# Patient Record
Sex: Male | Born: 1990 | Race: Black or African American | Hispanic: No | Marital: Single | Smoking: Current every day smoker
Health system: Southern US, Community
[De-identification: ages and names within clinical notes are randomized; demographics above are authoritative.]

---

## 2009-08-21 ENCOUNTER — Emergency Department (HOSPITAL_COMMUNITY): Admission: EM | Admit: 2009-08-21 | Discharge: 2009-08-21 | Payer: Self-pay | Admitting: Emergency Medicine

## 2017-04-01 ENCOUNTER — Emergency Department (HOSPITAL_BASED_OUTPATIENT_CLINIC_OR_DEPARTMENT_OTHER)
Admission: EM | Admit: 2017-04-01 | Discharge: 2017-04-01 | Disposition: A | Discharge status: Home / Self Care | Payer: Self-pay | Attending: Emergency Medicine | Admitting: Emergency Medicine

## 2017-04-01 ENCOUNTER — Encounter (HOSPITAL_BASED_OUTPATIENT_CLINIC_OR_DEPARTMENT_OTHER): Payer: Self-pay | Admitting: *Deleted

## 2017-04-01 ENCOUNTER — Other Ambulatory Visit: Payer: Self-pay

## 2017-04-01 DIAGNOSIS — J069 Acute upper respiratory infection, unspecified: Secondary | ICD-10-CM | POA: Insufficient documentation

## 2017-04-01 DIAGNOSIS — F17200 Nicotine dependence, unspecified, uncomplicated: Secondary | ICD-10-CM | POA: Insufficient documentation

## 2017-04-01 MED ORDER — PROMETHAZINE-DM 6.25-15 MG/5ML PO SYRP: 5.0000 mL | ORAL_SOLUTION | Freq: Four times a day (QID) | ORAL | 0 refills | Status: DC | PRN

## 2017-04-01 MED ORDER — BENZONATATE 100 MG PO CAPS: 100.0000 mg | ORAL_CAPSULE | Freq: Three times a day (TID) | ORAL | 0 refills | Status: DC

## 2017-04-01 NOTE — ED Notes (Signed)
Cough, congestion, chills x 2 days

## 2017-04-01 NOTE — ED Triage Notes (Signed)
Cough, congestion, and chills x 2 days

## 2017-04-01 NOTE — ED Provider Notes (Signed)
[facility] EMERGENCY DEPARTMENT Provider Note   CSN: [id number] Arrival date & time: 04/01/17  2039     History   Chief Complaint Chief Complaint  Patient presents with  . Nasal Congestion    HPI Taylor Fields is a 27 y.o. male.  HPI  27 year old male who is generally healthy presenting with cold symptoms.  For the past 2 days patient has had runny nose, sneezing, coughing, throat irritation, feeling tired and having some chills.  No report of fever, cough is nonproductive, no shortness of breath, and no nausea vomiting or diarrhea.  He has tried some over-the-counter medication such as Mucinex with minimal improvement.  He denies any recent travel.  Denies any recent sick contact.  History reviewed. No pertinent past medical history.  There are no active problems to display for this patient.   History reviewed. No pertinent surgical history.     Home Medications    Prior to Admission medications   Not on File    Family History No family history on file.  Social History Social History   Tobacco Use  . Smoking status: Current Every Day Smoker  . Smokeless tobacco: Never Used  Substance Use Topics  . Alcohol use: Yes    Comment: 2x weekly  . Drug use: No     Allergies   Patient has no known allergies.   Review of Systems Review of Systems  All other systems reviewed and are negative.    Physical Exam Updated Vital Signs BP (!) 147/85 (BP Location: Left Arm)   Pulse 92   Temp 98.7 F (37.1 C) (Oral)   Resp 18   Ht 6\' 3"  (1.905 m)   Wt 111.1 kg (245 lb)   SpO2 100%   BMI 30.62 kg/m   Physical Exam  Constitutional: He is oriented to person, place, and time. He appears well-developed and well-nourished. No distress.  HENT:  Head: Atraumatic.  Right Ear: External ear normal.  Left Ear: External ear normal.  Mouth/Throat: Oropharynx is clear and moist.  Eyes: Conjunctivae are normal.  Neck: Normal range of motion. Neck supple.    Cardiovascular: Normal rate and regular rhythm.  Pulmonary/Chest: Effort normal and breath sounds normal. No respiratory distress. He has no rales.  Abdominal: Soft. He exhibits no distension. There is no tenderness.  Lymphadenopathy:    He has no cervical adenopathy.  Neurological: He is alert and oriented to person, place, and time.  Skin: No rash noted.  Psychiatric: He has a normal mood and affect.  Nursing note and vitals reviewed.    ED Treatments / Results  Labs (all labs ordered are listed, but only abnormal results are displayed) Labs Reviewed - No data to display  EKG  EKG Interpretation None       Radiology No results found.  Procedures Procedures (including critical care time)  Medications Ordered in ED Medications - No data to display   Initial Impression / Assessment and Plan / ED Course  I have reviewed the triage vital signs and the nursing notes.  Pertinent labs & imaging results that were available during my care of the patient were reviewed by me and considered in my medical decision making (see chart for details).     BP (!) 147/85 (BP Location: Left Arm)   Pulse 92   Temp 98.7 F (37.1 C) (Oral)   Resp 18   Ht 6\' 3"  (1.905 m)   Wt 111.1 kg (245 lb)   SpO2 100%  BMI 30.62 kg/m    Final Clinical Impressions(s) / ED Diagnoses   Final diagnoses:  Acute upper respiratory infection    ED Discharge Orders        Ordered    benzonatate (TESSALON) 100 MG capsule  Every 8 hours     04/01/17 2144    promethazine-dextromethorphan (PROMETHAZINE-DM) 6.25-15 MG/5ML syrup  4 times daily PRN     04/01/17 2144     Pt symptoms consistent with URI.  Pt will be discharged with symptomatic treatment.  Discussed return precautions.  Pt is hemodynamically stable & in NAD prior to discharge.     Fayrene Helper, PA-C 04/01/17 4098    Gwyneth Sprout, MD 04/03/17 1539

## 2017-04-01 NOTE — ED Notes (Signed)
Pt given d/c instructions as per chart. Rx x 2. Verbalizes understanding. No questions. 

## 2017-06-21 ENCOUNTER — Other Ambulatory Visit: Payer: Self-pay

## 2017-06-21 ENCOUNTER — Emergency Department (HOSPITAL_BASED_OUTPATIENT_CLINIC_OR_DEPARTMENT_OTHER)
Admission: EM | Admit: 2017-06-21 | Discharge: 2017-06-21 | Disposition: A | Discharge status: Home / Self Care | Payer: Self-pay | Attending: Emergency Medicine | Admitting: Emergency Medicine

## 2017-06-21 ENCOUNTER — Encounter (HOSPITAL_BASED_OUTPATIENT_CLINIC_OR_DEPARTMENT_OTHER): Payer: Self-pay | Admitting: *Deleted

## 2017-06-21 DIAGNOSIS — J111 Influenza due to unidentified influenza virus with other respiratory manifestations: Secondary | ICD-10-CM | POA: Insufficient documentation

## 2017-06-21 DIAGNOSIS — F17210 Nicotine dependence, cigarettes, uncomplicated: Secondary | ICD-10-CM | POA: Insufficient documentation

## 2017-06-21 DIAGNOSIS — R69 Illness, unspecified: Secondary | ICD-10-CM

## 2017-06-21 MED ORDER — ONDANSETRON HCL 4 MG PO TABS: 4.0000 mg | ORAL_TABLET | Freq: Three times a day (TID) | ORAL | 0 refills | Status: AC | PRN

## 2017-06-21 MED ORDER — OSELTAMIVIR PHOSPHATE 75 MG PO CAPS
75.0000 mg | ORAL_CAPSULE | Freq: Once | ORAL | Status: AC
  Administered 2017-06-21: 75 mg via ORAL
  Filled 2017-06-21: qty 1

## 2017-06-21 MED ORDER — OSELTAMIVIR PHOSPHATE 75 MG PO CAPS: 75.0000 mg | ORAL_CAPSULE | Freq: Two times a day (BID) | ORAL | 0 refills | Status: AC

## 2017-06-21 MED ORDER — BENZONATATE 100 MG PO CAPS: 100.0000 mg | ORAL_CAPSULE | Freq: Three times a day (TID) | ORAL | 0 refills | Status: AC | PRN

## 2017-06-21 NOTE — ED Provider Notes (Signed)
Emergency Department Provider Note   I have reviewed the triage vital signs and the nursing notes.   HISTORY  Chief Complaint Cough   HPI Taylor Fields is a 27 y.o. male smoker without any significant medical problems or presents to the emergency department today secondary to cough.  Patient states that he came up from yesterday and within a few hours started feeling really ill.  He was having muscle aches, joint aches, fever, cough, congestion, runny nose and throughout the night this all continued and he started having chills as well.  Max temperature overnight was 102.  Not exposed to be also similar illnesses that he knows of.  No other medical problems.  Try some Tylenol at home but did not seem to help.  Did not try anything else did not see anyone else for symptoms. No other associated or modifying symptoms.    History reviewed. No pertinent past medical history.  There are no active problems to display for this patient.   History reviewed. No pertinent surgical history.  Current Outpatient Rx  . Order #: [id number] Class: Print  . Order #: [id number] Class: Print  . Order #: [id number] Class: Print    Allergies Patient has no known allergies.  No family history on file.  Social History Social History   Tobacco Use  . Smoking status: Current Every Day Smoker  . Smokeless tobacco: Never Used  Substance Use Topics  . Alcohol use: Yes    Comment: 2x weekly  . Drug use: No    Review of Systems  All other systems negative except as documented in the HPI. All pertinent positives and negatives as reviewed in the HPI. ____________________________________________   PHYSICAL EXAM:  VITAL SIGNS: ED Triage Vitals  Enc Vitals Group     BP 06/21/17 0657 131/64     Pulse Rate 06/21/17 0657 86     Resp 06/21/17 0657 18     Temp 06/21/17 0657 100 F (37.8 C)     Temp Source 06/21/17 0657 Oral     SpO2 06/21/17 0657 99 %     Weight 06/21/17 0657 250 lb (113.4 kg)   Height 06/21/17 0657 6\' 3"  (1.905 m)     Head Circumference --      Peak Flow --      Pain Score 06/21/17 0659 9     Pain Loc --      Pain Edu? --      Excl. in GC? --     Constitutional: Alert and oriented. Well appearing and in no acute distress. Eyes: Conjunctivae are normal. PERRL. EOMI. Head: Atraumatic. Nose: No congestion/rhinnorhea. Mouth/Throat: Mucous membranes are moist.  Oropharynx non-erythematous. Neck: No stridor.  No meningeal signs.   Cardiovascular: Normal rate, regular rhythm. Good peripheral circulation. Grossly normal heart sounds.   Respiratory: Normal respiratory effort.  No retractions. Lungs CTAB. Gastrointestinal: Soft and nontender. No distention.  Musculoskeletal: No lower extremity tenderness nor edema. No gross deformities of extremities. Neurologic:  Normal speech and language. No gross focal neurologic deficits are appreciated.  Skin:  Skin is warm, dry and intact. No rash noted.   ____________________________________________   INITIAL IMPRESSION / ASSESSMENT AND PLAN / ED COURSE  Signs and symptoms consistent with influenza or influenza-like illness.  Will treat appropriately.  No evidence of pneumonia, bronchitis or anything requiring antibiotics.  No indication for imaging.   Pertinent labs & imaging results that were available during my care of the patient were reviewed by me and considered in  my medical decision making (see chart for details).  ____________________________________________  FINAL CLINICAL IMPRESSION(S) / ED DIAGNOSES  Final diagnoses:  Influenza-like illness    MEDICATIONS GIVEN DURING THIS VISIT:  Medications  oseltamivir (TAMIFLU) capsule 75 mg (has no administration in time range)     NEW OUTPATIENT MEDICATIONS STARTED DURING THIS VISIT:  New Prescriptions   BENZONATATE (TESSALON) 100 MG CAPSULE    Take 1 capsule (100 mg total) by mouth 3 (three) times daily as needed for cough.   ONDANSETRON (ZOFRAN) 4 MG  TABLET    Take 1 tablet (4 mg total) by mouth every 8 (eight) hours as needed for nausea or vomiting.   OSELTAMIVIR (TAMIFLU) 75 MG CAPSULE    Take 1 capsule (75 mg total) by mouth every 12 (twelve) hours.    Note:  This note was prepared with assistance of Dragon voice recognition software. Occasional wrong-word or sound-a-like substitutions may have occurred due to the inherent limitations of voice recognition software.   Marily Memos, MD 06/21/17 [phone]

## 2017-06-21 NOTE — ED Triage Notes (Signed)
Pt reports coughing, sneezing and chills. Last tylenol at 2100 last night.

## 2020-01-15 ENCOUNTER — Emergency Department (INDEPENDENT_AMBULATORY_CARE_PROVIDER_SITE_OTHER)
Admission: EM | Admit: 2020-01-15 | Discharge: 2020-01-15 | Disposition: A | Discharge status: Home / Self Care | Payer: Managed Care, Other (non HMO) | Source: Home / Self Care | Attending: Family Medicine | Admitting: Family Medicine

## 2020-01-15 ENCOUNTER — Other Ambulatory Visit: Payer: Self-pay

## 2020-01-15 ENCOUNTER — Emergency Department (INDEPENDENT_AMBULATORY_CARE_PROVIDER_SITE_OTHER): Payer: Managed Care, Other (non HMO)

## 2020-01-15 DIAGNOSIS — M79671 Pain in right foot: Secondary | ICD-10-CM

## 2020-01-15 DIAGNOSIS — M722 Plantar fascial fibromatosis: Secondary | ICD-10-CM

## 2020-01-15 MED ORDER — PREDNISONE 20 MG PO TABS: ORAL_TABLET | ORAL | 0 refills | Status: AC

## 2020-01-15 NOTE — ED Provider Notes (Signed)
[facility]    CSN: [id number] Arrival date & time: 01/15/20  0737      History   Chief Complaint Chief Complaint  Patient presents with  . Foot Pain    RT heel    HPI Taylor Fields is a 29 y.o. male.   Patient complains of intermittent pain in his right heel for about two years, worse recently.  He recalls no injury.  The history is provided by the patient.  Foot Pain This is a chronic problem. Episode onset: 2 years ago. The problem occurs constantly. The problem has been gradually worsening. The symptoms are aggravated by walking and standing. Nothing relieves the symptoms. He has tried nothing for the symptoms.    History reviewed. No pertinent past medical history.  There are no problems to display for this patient.   History reviewed. No pertinent surgical history.     Home Medications    Prior to Admission medications   Medication Sig Start Date End Date Taking? Authorizing Provider  benzonatate (TESSALON) 100 MG capsule Take 1 capsule (100 mg total) by mouth 3 (three) times daily as needed for cough. 06/21/17   Mesner, Barbara Cower, MD  ondansetron (ZOFRAN) 4 MG tablet Take 1 tablet (4 mg total) by mouth every 8 (eight) hours as needed for nausea or vomiting. 06/21/17   Mesner, Barbara Cower, MD  oseltamivir (TAMIFLU) 75 MG capsule Take 1 capsule (75 mg total) by mouth every 12 (twelve) hours. 06/21/17   Mesner, Barbara Cower, MD  predniSONE (DELTASONE) 20 MG tablet Take one tab by mouth twice daily for 4 days, then one daily for 3 days. Take with food. 01/15/20   Lattie Haw, MD    Family History History reviewed. No pertinent family history.  Social History Social History   Tobacco Use  . Smoking status: Current Every Day Smoker  . Smokeless tobacco: Never Used  Vaping Use  . Vaping Use: Never used  Substance Use Topics  . Alcohol use: Yes    Comment: 2x weekly  . Drug use: No     Allergies   Patient has no known allergies.   Review of  Systems Review of Systems  Constitutional: Negative for chills, diaphoresis, fatigue and fever.  Musculoskeletal:       Right heel pain  Skin: Negative for color change.  All other systems reviewed and are negative.    Physical Exam Triage Vital Signs ED Triage Vitals  Enc Vitals Group     BP 01/15/20 1906 131/68     Pulse Rate 01/15/20 1906 84     Resp 01/15/20 1906 17     Temp 01/15/20 1906 98 F (36.7 C)     Temp Source 01/15/20 1906 Oral     SpO2 01/15/20 1906 98 %     Weight --      Height --      Head Circumference --      Peak Flow --      Pain Score 01/15/20 1909 7     Pain Loc --      Pain Edu? --      Excl. in GC? --    No data found.  Updated Vital Signs BP 131/68 (BP Location: Right Arm)   Pulse 84   Temp 98 F (36.7 C) (Oral)   Resp 17   SpO2 98%   Visual Acuity Right Eye Distance:   Left Eye Distance:   Bilateral Distance:    Right Eye Near:   Left  Eye Near:    Bilateral Near:     Physical Exam Vitals and nursing note reviewed.  Constitutional:      General: He is not in acute distress. HENT:     Head: Normocephalic.  Eyes:     Pupils: Pupils are equal, round, and reactive to light.  Cardiovascular:     Rate and Rhythm: Normal rate.  Pulmonary:     Effort: Pulmonary effort is normal.  Musculoskeletal:     Comments: There is distinct tenderness to palpation over the plantar surface of the right heel.  No swelling, erythema, or warmth.  Skin:    General: Skin is warm and dry.  Neurological:     Mental Status: He is alert.      UC Treatments / Results  Labs (all labs ordered are listed, but only abnormal results are displayed) Labs Reviewed - No data to display  EKG   Radiology DG Os Calcis Right  Result Date: 01/15/2020 CLINICAL DATA:  Rule out stress fracture, right heel pain for 1 month, no known injury EXAM: RIGHT OS CALCIS - 2+ VIEW COMPARISON:  None. FINDINGS: No clear sclerotic band, cortical disruption or abnormality  perpendicular to the trabecular lines of the calcaneus to reflect acute or healing stress fracture/stress reaction. Midfoot and hindfoot alignment is grossly preserved within the limitations of a nonweightbearing exam. Small corticated os trigonum and os peroneum are noted. IMPRESSION: No radiographic evidence of stress fracture/stress reaction. Electronically Signed   By: Kreg Shropshire M.D.   On: 01/15/2020 19:31    Procedures Procedures (including critical care time)  Medications Ordered in UC Medications - No data to display  Initial Impression / Assessment and Plan / UC Course  I have reviewed the triage vital signs and the nursing notes.  Pertinent labs & imaging results that were available during my care of the patient were reviewed by me and considered in my medical decision making (see chart for details).    Begin prednisone burst/taper. Given sprain treatment instructions with range of motion and stretching exercises. Followup with Dr[facility] if not improving about two weeks.    Final Clinical Impressions(s) / UC Diagnoses   Final diagnoses:  Plantar fasciitis of right foot     Discharge Instructions     Obtain good shoe inserts for both shoes that have a heel cup and good arch support.  Apply ice pack for 15 to 20 minutes, 3 to 4 times daily  Continue until pain decreases.  Obtain night splints (try amazon.com).  Begin calf stretching exercises.    ED Prescriptions    Medication Sig Dispense Auth. Provider   predniSONE (DELTASONE) 20 MG tablet Take one tab by mouth twice daily for 4 days, then one daily for 3 days. Take with food. 11 tablet Lattie Haw, MD        Lattie Haw, MD 01/20/20 [phone]

## 2020-01-15 NOTE — Discharge Instructions (Addendum)
Obtain good shoe inserts for both shoes that have a heel cup and good arch support.  Apply ice pack for 15 to 20 minutes, 3 to 4 times daily  Continue until pain decreases.  Obtain night splints (try amazon.com).  Begin calf stretching exercises.

## 2020-01-15 NOTE — ED Triage Notes (Signed)
Pt c/o intermittent RT heel pain x 1 year. Pain worsens with with flexing of foot. Constantly on feet at work. Pain is throbbing, sometimes sharp.  Pain 7/10

## 2020-08-24 ENCOUNTER — Encounter: Payer: Self-pay | Admitting: Emergency Medicine

## 2020-08-24 ENCOUNTER — Emergency Department
Admission: EM | Admit: 2020-08-24 | Discharge: 2020-08-24 | Disposition: A | Discharge status: Home / Self Care | Payer: Managed Care, Other (non HMO) | Source: Home / Self Care

## 2020-08-24 ENCOUNTER — Other Ambulatory Visit: Payer: Self-pay

## 2020-08-24 DIAGNOSIS — J0140 Acute pansinusitis, unspecified: Secondary | ICD-10-CM | POA: Diagnosis not present

## 2020-08-24 MED ORDER — AMOXICILLIN-POT CLAVULANATE 875-125 MG PO TABS: 1.0000 | ORAL_TABLET | Freq: Two times a day (BID) | ORAL | 0 refills | Status: AC

## 2020-08-24 NOTE — ED Provider Notes (Signed)
[facility]   [id number] 08/24/20 Arrival Time: 1716  IE:PPIR THROAT  SUBJECTIVE: History from: patient.  Paiton Fosco is a 30 y.o. male who presents with abrupt onset of nasal congestion, headache, fatigue for the last 5 days. Reports purulent nasal discharge. Reports negative Covid test at home yesterday. Denies sick exposure to Covid, strep, flu or mono, or precipitating event. Has negative history of Covid. Has not had Covid vaccines. Has tried sudafed and zyrtec without relief. There are no aggravating symptoms. Denies previous symptoms in the past.     Denies fever, chills, ear pain, rhinorrhea, cough, SOB, wheezing, chest pain, nausea, rash, changes in bowel or bladder habits.     ROS: As per HPI.  All other pertinent ROS negative.     History reviewed. No pertinent past medical history. History reviewed. No pertinent surgical history. No Known Allergies No current facility-administered medications on file prior to encounter.   Current Outpatient Medications on File Prior to Encounter  Medication Sig Dispense Refill   benzonatate (TESSALON) 100 MG capsule Take 1 capsule (100 mg total) by mouth 3 (three) times daily as needed for cough. 21 capsule 0   ondansetron (ZOFRAN) 4 MG tablet Take 1 tablet (4 mg total) by mouth every 8 (eight) hours as needed for nausea or vomiting. 12 tablet 0   oseltamivir (TAMIFLU) 75 MG capsule Take 1 capsule (75 mg total) by mouth every 12 (twelve) hours. 10 capsule 0   predniSONE (DELTASONE) 20 MG tablet Take one tab by mouth twice daily for 4 days, then one daily for 3 days. Take with food. 11 tablet 0   Social History   Socioeconomic History   Marital status: Single    Spouse name: Not on file   Number of children: Not on file   Years of education: Not on file   Highest education level: Not on file  Occupational History   Not on file  Tobacco Use   Smoking status: Every Day    Pack years: 0.00   Smokeless tobacco: Never   Vaping Use   Vaping Use: Never used  Substance and Sexual Activity   Alcohol use: Yes    Comment: 2x weekly   Drug use: No   Sexual activity: Not on file  Other Topics Concern   Not on file  Social History Narrative   Not on file   Social Determinants of Health   Financial Resource Strain: Not on file  Food Insecurity: Not on file  Transportation Needs: Not on file  Physical Activity: Not on file  Stress: Not on file  Social Connections: Not on file  Intimate Partner Violence: Not on file   No family history on file.  OBJECTIVE:  Vitals:   08/24/20 1741 08/24/20 1748  BP:  124/74  Pulse:  65  Resp:  16  Temp:  98.2 F (36.8 C)  TempSrc:  Oral  SpO2:  98%  Weight: 235 lb (106.6 kg)   Height: 6\' 3"  (1.905 m)      General appearance: alert; appears fatigued, but nontoxic, speaking in full sentences and managing own secretions HEENT: NCAT; Ears: EACs clear, TMs pearly gray with visible cone of light, without erythema; Eyes: PERRL, EOMI grossly; Nose: no obvious rhinorrhea; Throat: oropharynx clear, tonsils 1+ and mildly erythematous without white tonsillar exudates, uvula midline; Sinuses: tender to palpation Neck: supple with LAD Lungs: CTA bilaterally without adventitious breath sounds; cough absent Heart: regular rate and rhythm.  Radial pulses 2+ symmetrical bilaterally Skin:  warm and dry Psychological: alert and cooperative; normal mood and affect  LABS: No results found for this or any previous visit (from the past 24 hour(s)).   ASSESSMENT & PLAN:  1. Acute non-recurrent pansinusitis     Meds ordered this encounter  Medications   amoxicillin-clavulanate (AUGMENTIN) 875-125 MG tablet    Sig: Take 1 tablet by mouth 2 (two) times daily for 7 days.    Dispense:  14 tablet    Refill:  0    Order Specific Question:   Supervising Provider    Answer:   Merrilee Jansky [id number]     Acute Sinusitis Push fluids and get rest Prescribed Augmentin 875 mg  BID x 7 days as empiric therapy for sinusitis given purulent discharge and sinus tenderness  Take as directed and to completion.  Drink warm or cool liquids, use throat lozenges, or popsicles to help alleviate symptoms Take OTC ibuprofen or tylenol as needed for pain May use Zyrtec D and flonase to help alleviate symptoms Follow up with PCP if symptoms persist Return or go to ER if you have any new or worsening symptoms such as fever, chills, nausea, vomiting, worsening sore throat, cough, abdominal pain, chest pain, changes in bowel or bladder habits.   Reviewed expectations re: course of current medical issues. Questions answered. Outlined signs and symptoms indicating need for more acute intervention. Patient verbalized understanding. After Visit Summary given.           Moshe Cipro, NP 08/24/20 1756

## 2020-08-24 NOTE — Discharge Instructions (Addendum)
I have sent in Augmentin for you to take twice a day for 7 days.  May use sudafed and nasal spray for symptomatic relief  Follow up with this office or with primary care if symptoms are persisting.  Follow up in the ER for high fever, trouble swallowing, trouble breathing, other concerning symptoms.

## 2020-08-24 NOTE — ED Triage Notes (Signed)
Patient here day 5 of congestion; yesterday did home covid test and it was negative; today he also has headache and facial pain. Has not had covid vaccinations.

## 2021-06-07 ENCOUNTER — Encounter (HOSPITAL_BASED_OUTPATIENT_CLINIC_OR_DEPARTMENT_OTHER): Payer: Self-pay | Admitting: Urology

## 2021-06-07 ENCOUNTER — Other Ambulatory Visit: Payer: Self-pay

## 2021-06-07 ENCOUNTER — Emergency Department (HOSPITAL_BASED_OUTPATIENT_CLINIC_OR_DEPARTMENT_OTHER)
Admission: EM | Admit: 2021-06-07 | Discharge: 2021-06-07 | Disposition: A | Discharge status: Home / Self Care | Payer: Managed Care, Other (non HMO) | Attending: Emergency Medicine | Admitting: Emergency Medicine

## 2021-06-07 DIAGNOSIS — J069 Acute upper respiratory infection, unspecified: Secondary | ICD-10-CM | POA: Insufficient documentation

## 2021-06-07 DIAGNOSIS — J302 Other seasonal allergic rhinitis: Secondary | ICD-10-CM | POA: Insufficient documentation

## 2021-06-07 DIAGNOSIS — Z7951 Long term (current) use of inhaled steroids: Secondary | ICD-10-CM | POA: Insufficient documentation

## 2021-06-07 DIAGNOSIS — Z20822 Contact with and (suspected) exposure to covid-19: Secondary | ICD-10-CM | POA: Insufficient documentation

## 2021-06-07 LAB — RESP PANEL BY RT-PCR (FLU A&B, COVID) ARPGX2
Influenza A by PCR: NEGATIVE
Influenza B by PCR: NEGATIVE
SARS Coronavirus 2 by RT PCR: NEGATIVE

## 2021-06-07 MED ORDER — DEXAMETHASONE 4 MG PO TABS
6.0000 mg | ORAL_TABLET | Freq: Every day | ORAL | Status: DC
  Administered 2021-06-07: 6 mg via ORAL
  Filled 2021-06-07: qty 2

## 2021-06-07 MED ORDER — IBUPROFEN 800 MG PO TABS
800.0000 mg | ORAL_TABLET | Freq: Once | ORAL | Status: AC
  Administered 2021-06-07: 800 mg via ORAL
  Filled 2021-06-07: qty 1

## 2021-06-07 MED ORDER — FLUTICASONE PROPIONATE 50 MCG/ACT NA SUSP: 2.0000 | Freq: Every day | NASAL | 0 refills | Status: AC

## 2021-06-07 NOTE — ED Provider Notes (Signed)
?[facility] EMERGENCY DEPARTMENT ?Provider Note ? ? ?CSN: [id number] ?Arrival date & time: 06/07/21  1807 ? ?  ? ?History ? ?Chief Complaint  ?Patient presents with  ? Nasal Congestion  ? ? ?Taylor Fields is a 31 y.o. male. ? ?HPI ? ? Patient is a 31yo male presenting to ED due to nasal congestion, fever, and sore throat x 4 days. Did not know he had a fever prior to arrival, oral temp 100.4 in ED. Has tried zyrtec with minimal relief. Endorses nasal congestion and rhinorrhea. No SOB.  ? ?Home Medications ?Prior to Admission medications   ?Medication Sig Start Date End Date Taking? Authorizing Provider  ?fluticasone (FLONASE) 50 MCG/ACT nasal spray Place 2 sprays into both nostrils daily. 06/07/21  Yes Theron Arista, PA-C  ?benzonatate (TESSALON) 100 MG capsule Take 1 capsule (100 mg total) by mouth 3 (three) times daily as needed for cough. 06/21/17   Mesner, Barbara Cower, MD  ?ondansetron (ZOFRAN) 4 MG tablet Take 1 tablet (4 mg total) by mouth every 8 (eight) hours as needed for nausea or vomiting. 06/21/17   Mesner, Barbara Cower, MD  ?oseltamivir (TAMIFLU) 75 MG capsule Take 1 capsule (75 mg total) by mouth every 12 (twelve) hours. 06/21/17   Mesner, Barbara Cower, MD  ?predniSONE (DELTASONE) 20 MG tablet Take one tab by mouth twice daily for 4 days, then one daily for 3 days. Take with food. 01/15/20   Lattie Haw, MD  ?   ? ?Allergies    ?Patient has no known allergies.   ? ?Review of Systems   ?Review of Systems ? ?Physical Exam ?Updated Vital Signs ?BP 122/74 (BP Location: Right Arm)   Pulse 84   Temp 98.6 ?F (37 ?C) (Oral)   Resp 16   Ht 6\' 3"  (1.905 m)   Wt 106.6 kg   SpO2 99%   BMI 29.37 kg/m?  ?Physical Exam ?Vitals and nursing note reviewed. Exam conducted with a chaperone present.  ?Constitutional:   ?   General: He is not in acute distress. ?   Appearance: Normal appearance.  ?HENT:  ?   Head: Normocephalic and atraumatic.  ?   Nose: Congestion and rhinorrhea present.  ?   Mouth/Throat:  ?   Comments:  Tonsillar edema bilaterally. Uvula midline.  ?Eyes:  ?   General: No scleral icterus. ?   Extraocular Movements: Extraocular movements intact.  ?   Pupils: Pupils are equal, round, and reactive to light.  ?Skin: ?   Coloration: Skin is not jaundiced.  ?Neurological:  ?   Mental Status: He is alert. Mental status is at baseline.  ?   Coordination: Coordination normal.  ? ? ?ED Results / Procedures / Treatments   ?Labs ?(all labs ordered are listed, but only abnormal results are displayed) ?Labs Reviewed  ?RESP PANEL BY RT-PCR (FLU A&B, COVID) ARPGX2  ? ? ?EKG ?None ? ?Radiology ?No results found. ? ?Procedures ?Procedures  ? ? ?Medications Ordered in ED ?Medications  ?dexamethasone (DECADRON) tablet 6 mg (6 mg Oral Given 06/07/21 1840)  ?ibuprofen (ADVIL) tablet 800 mg (800 mg Oral Given 06/07/21 1840)  ? ? ?ED Course/ Medical Decision Making/ A&P ?  ?                        ?Medical Decision Making ?Risk ?Prescription drug management. ? ? ?Patient presents with nasal congestion. HE has a fever on triage, improved when given motrin. Covid/flu negative.  Physical exam overall reassuring.  No hypoxia, tachypnea.  Lungs are clear to auscultation bilaterally, he does have nasal congestion and clear rhinorrhea.  Suspect this is more of an allergy presentation.  His COVID and flu test were negative, discussed plan with patient.  Will discharge with Flonase, suspect viral URI.  Return precautions given, work note provided.  Discharged in stable condition. ? ? ? ? ? ? ? ?Final Clinical Impression(s) / ED Diagnoses ?Final diagnoses:  ?Viral upper respiratory tract infection  ?Seasonal allergies  ? ? ?Rx / DC Orders ?ED Discharge Orders   ? ?      Ordered  ?  fluticasone (FLONASE) 50 MCG/ACT nasal spray  Daily       ? 06/07/21 1838  ? ?  ?  ? ?  ? ? ?  ?Theron Arista, PA-C ?06/07/21 2312 ? ?  ?Pricilla Loveless, MD ?06/17/21 [phone] ? ?

## 2021-06-07 NOTE — Discharge Instructions (Addendum)
Continue taking the Zyrtec for allergies.  Treated with a shot of Decadron here in the ED showed that should help improve some of the sore throat start using Flonase in each nare twice daily.  You can take Tylenol Motrin for fever pain.  Return to work when fever free for 24 hours.  Work note provided below.  You are negative for COVID and flu.  Expect you have a different viral infection versus allergies. ?

## 2021-06-07 NOTE — ED Triage Notes (Signed)
Pt states sinus congestions and pressure x 4 days ?Pain in throat with swallowing ?Lower back pain  ?Denies fever  ?H/o seasonal allergies ?

## 2021-08-20 IMAGING — DX DG OS CALCIS 2+V*R*
2 series · 2 of 2 positions shown · non-contrast
Comparison: None.

CLINICAL DATA: Rule out stress fracture, right heel pain for 1
month, no known injury

EXAM:
RIGHT OS CALCIS - 2+ VIEW

[calcaneus axial]
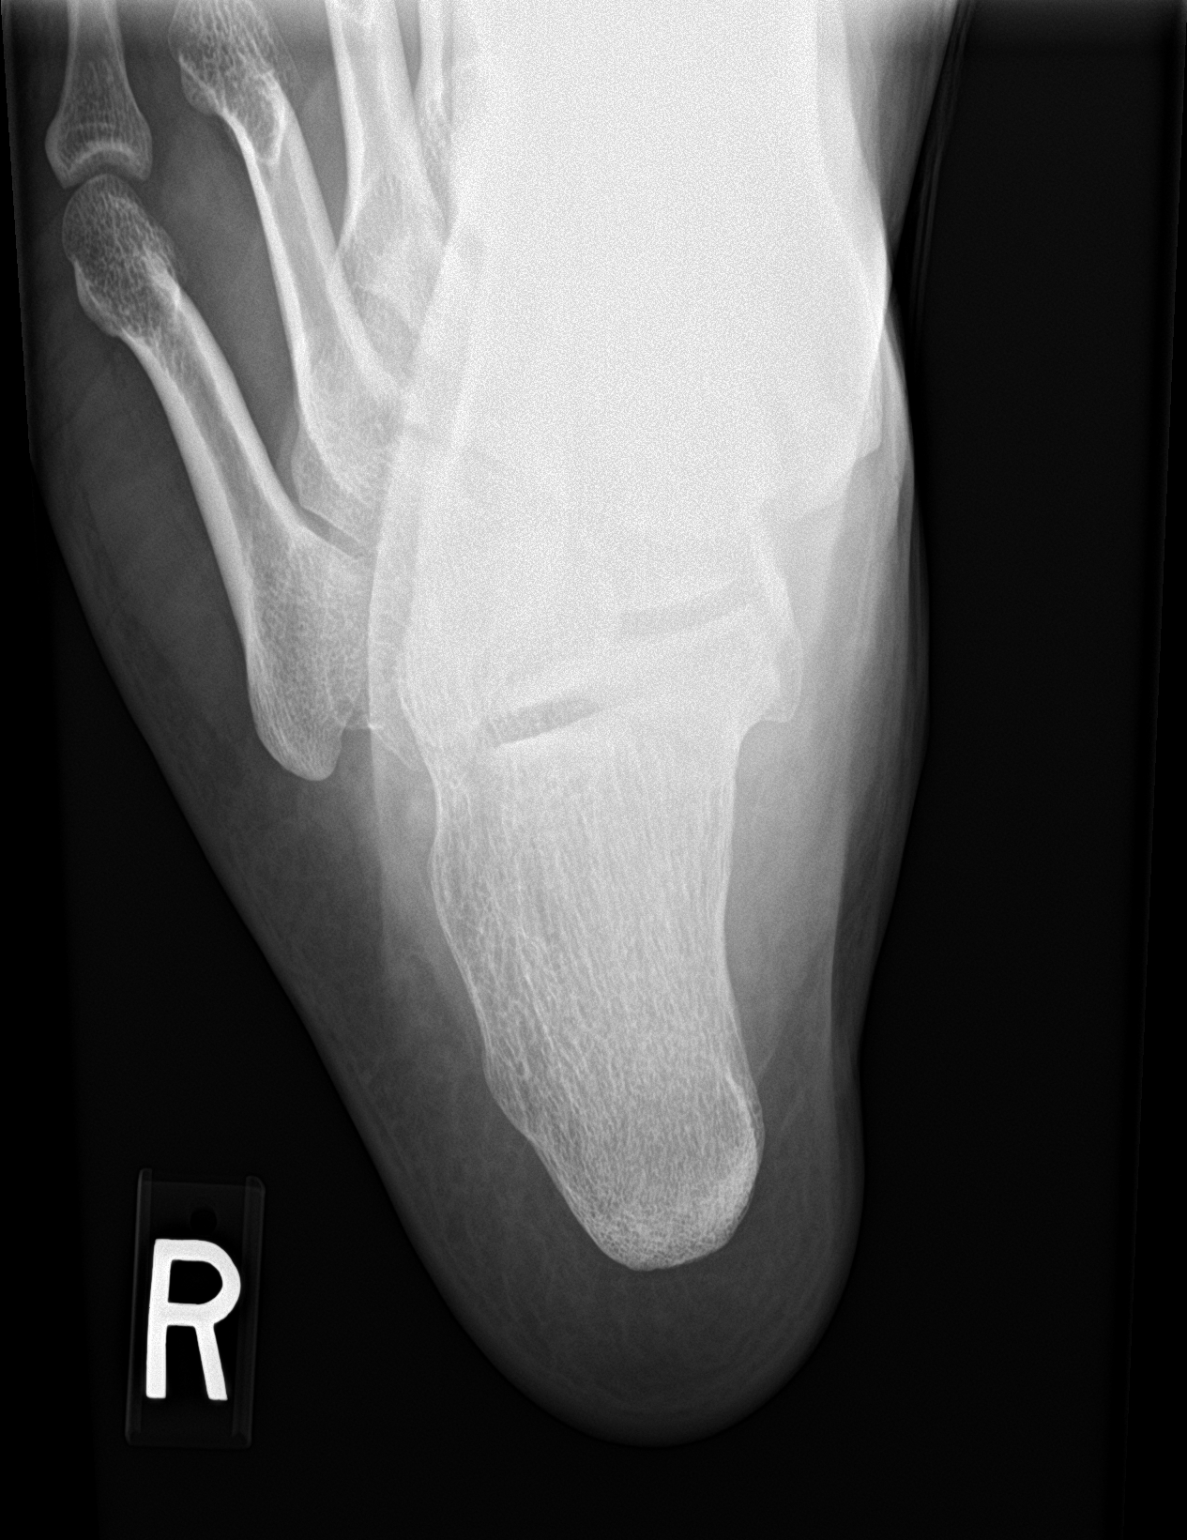

[calcaneus lat]
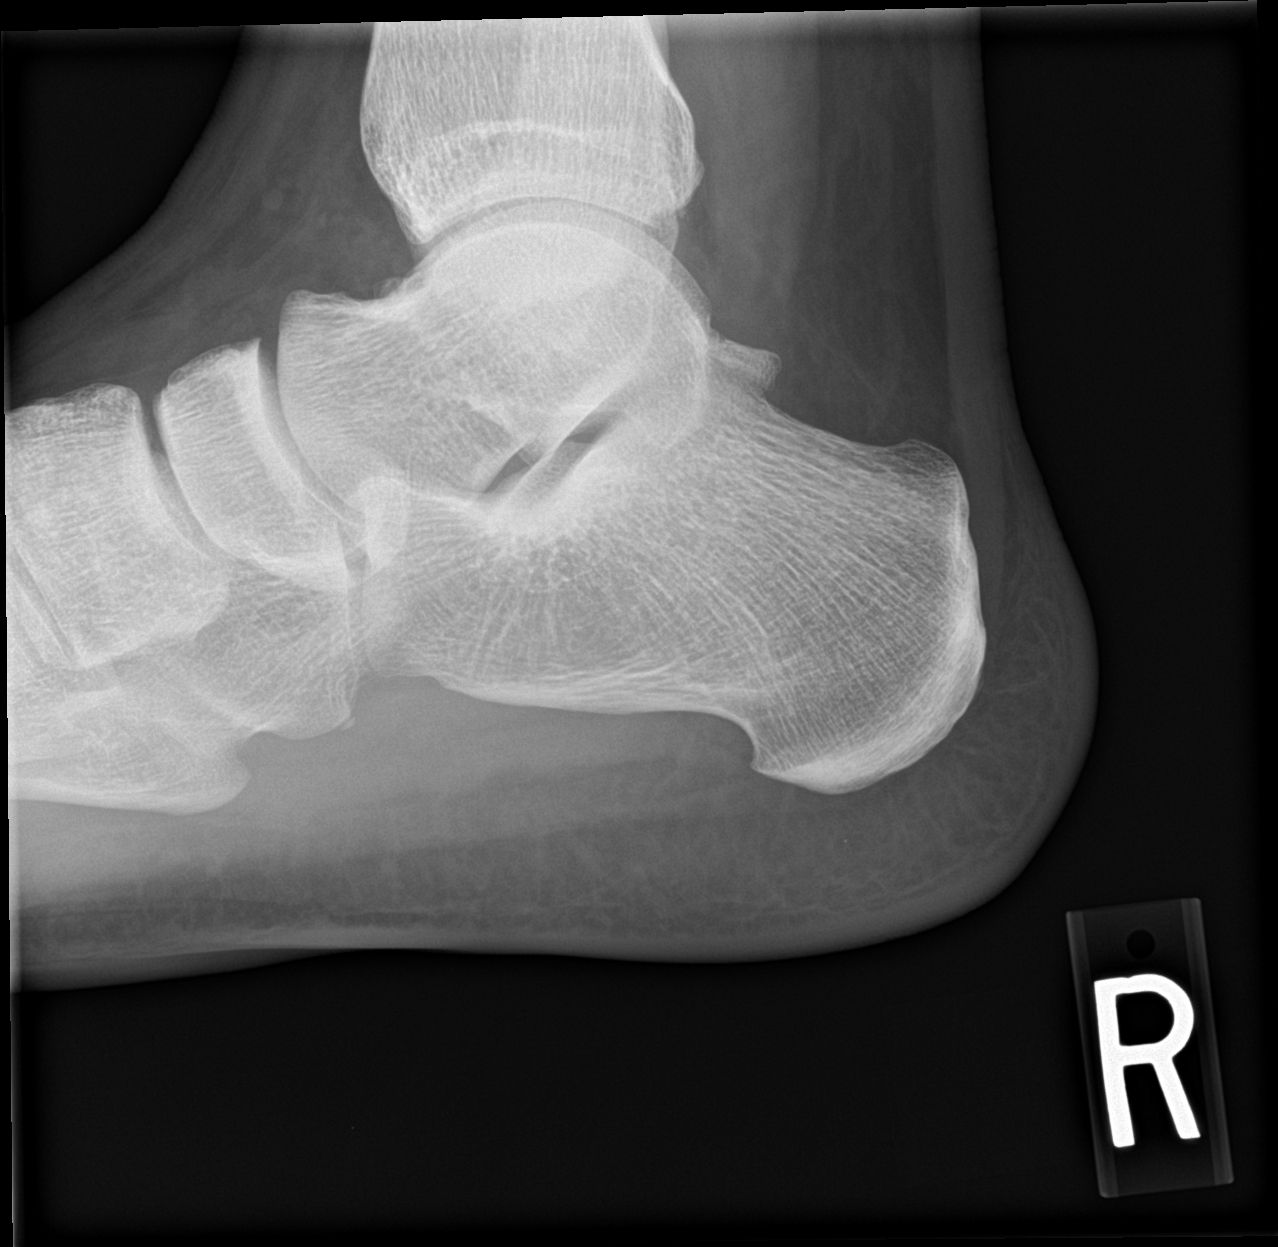

[2 of 2 positions shown; findings below may reference images not displayed]

FINDINGS: No clear sclerotic band, cortical disruption or abnormality
perpendicular to the trabecular lines of the calcaneus to reflect
acute or healing stress fracture/stress reaction. Midfoot and
hindfoot alignment is grossly preserved within the limitations of a
nonweightbearing exam. Small corticated os trigonum and os peroneum
are noted.
IMPRESSION: No radiographic evidence of stress fracture/stress reaction.
